# Patient Record
Sex: Male | Born: 1945 | Race: White | Hispanic: No | Marital: Married
Health system: Southern US, Community
[De-identification: ages and names within clinical notes are randomized; demographics above are authoritative.]

---

## 2001-09-06 ENCOUNTER — Encounter: Admission: RE | Admit: 2001-09-06 | Discharge: 2001-09-06 | Payer: Self-pay | Admitting: Neurosurgery

## 2001-09-06 ENCOUNTER — Encounter: Payer: Self-pay | Admitting: Neurosurgery

## 2005-09-28 ENCOUNTER — Ambulatory Visit: Payer: Self-pay | Admitting: Gastroenterology

## 2005-10-19 ENCOUNTER — Ambulatory Visit: Payer: Self-pay | Admitting: Gastroenterology

## 2007-05-27 ENCOUNTER — Ambulatory Visit: Payer: Self-pay | Admitting: Gastroenterology

## 2008-06-12 ENCOUNTER — Ambulatory Visit: Payer: Self-pay | Admitting: Internal Medicine

## 2008-06-24 ENCOUNTER — Ambulatory Visit: Payer: Self-pay | Admitting: Family Medicine

## 2008-06-29 ENCOUNTER — Ambulatory Visit: Payer: Self-pay | Admitting: Internal Medicine

## 2008-07-01 ENCOUNTER — Ambulatory Visit: Payer: Self-pay | Admitting: Internal Medicine

## 2008-07-09 ENCOUNTER — Ambulatory Visit: Payer: Self-pay | Admitting: Internal Medicine

## 2008-07-13 ENCOUNTER — Ambulatory Visit: Payer: Self-pay | Admitting: Internal Medicine

## 2008-08-10 ENCOUNTER — Ambulatory Visit: Payer: Self-pay | Admitting: Internal Medicine

## 2008-09-10 ENCOUNTER — Ambulatory Visit: Payer: Self-pay | Admitting: Internal Medicine

## 2008-10-08 ENCOUNTER — Ambulatory Visit: Payer: Self-pay | Admitting: Nurse Practitioner

## 2008-10-10 ENCOUNTER — Ambulatory Visit: Payer: Self-pay | Admitting: Internal Medicine

## 2008-10-10 ENCOUNTER — Emergency Department: Payer: Self-pay | Admitting: Emergency Medicine

## 2008-10-19 ENCOUNTER — Ambulatory Visit: Payer: Self-pay | Admitting: Vascular Surgery

## 2008-11-10 ENCOUNTER — Ambulatory Visit: Payer: Self-pay | Admitting: Internal Medicine

## 2008-12-10 ENCOUNTER — Ambulatory Visit: Payer: Self-pay | Admitting: Internal Medicine

## 2009-01-10 ENCOUNTER — Ambulatory Visit: Payer: Self-pay | Admitting: Internal Medicine

## 2009-02-10 ENCOUNTER — Ambulatory Visit: Payer: Self-pay | Admitting: Internal Medicine

## 2009-03-02 ENCOUNTER — Emergency Department: Payer: Self-pay | Admitting: Emergency Medicine

## 2009-03-12 ENCOUNTER — Ambulatory Visit: Payer: Self-pay | Admitting: Internal Medicine

## 2009-04-12 ENCOUNTER — Ambulatory Visit: Payer: Self-pay | Admitting: Internal Medicine

## 2009-05-12 ENCOUNTER — Ambulatory Visit: Payer: Self-pay | Admitting: Internal Medicine

## 2009-06-05 ENCOUNTER — Emergency Department: Payer: Self-pay | Admitting: Emergency Medicine

## 2009-06-12 ENCOUNTER — Ambulatory Visit: Payer: Self-pay | Admitting: Internal Medicine

## 2009-07-07 ENCOUNTER — Ambulatory Visit: Payer: Self-pay | Admitting: Cardiology

## 2009-07-13 ENCOUNTER — Ambulatory Visit: Payer: Self-pay | Admitting: Internal Medicine

## 2009-08-10 ENCOUNTER — Ambulatory Visit: Payer: Self-pay | Admitting: Internal Medicine

## 2009-09-10 ENCOUNTER — Ambulatory Visit: Payer: Self-pay | Admitting: Internal Medicine

## 2009-10-10 ENCOUNTER — Ambulatory Visit: Payer: Self-pay | Admitting: Internal Medicine

## 2009-11-10 ENCOUNTER — Ambulatory Visit: Payer: Self-pay | Admitting: Internal Medicine

## 2009-12-10 ENCOUNTER — Ambulatory Visit: Payer: Self-pay | Admitting: Internal Medicine

## 2010-01-03 ENCOUNTER — Ambulatory Visit: Payer: Self-pay | Admitting: Internal Medicine

## 2010-01-10 ENCOUNTER — Ambulatory Visit: Payer: Self-pay | Admitting: Internal Medicine

## 2010-02-10 ENCOUNTER — Ambulatory Visit: Payer: Self-pay | Admitting: Internal Medicine

## 2010-03-12 ENCOUNTER — Ambulatory Visit: Payer: Self-pay | Admitting: Internal Medicine

## 2010-04-06 ENCOUNTER — Encounter: Payer: Self-pay | Admitting: Neurosurgery

## 2010-04-12 ENCOUNTER — Ambulatory Visit: Payer: Self-pay | Admitting: Internal Medicine

## 2010-04-12 ENCOUNTER — Encounter: Payer: Self-pay | Admitting: Neurosurgery

## 2010-04-28 ENCOUNTER — Emergency Department: Payer: Self-pay | Admitting: Unknown Physician Specialty

## 2010-05-12 ENCOUNTER — Ambulatory Visit: Payer: Self-pay | Admitting: Internal Medicine

## 2010-05-12 ENCOUNTER — Encounter: Payer: Self-pay | Admitting: Neurosurgery

## 2010-06-12 ENCOUNTER — Ambulatory Visit: Payer: Self-pay | Admitting: Internal Medicine

## 2010-06-12 ENCOUNTER — Encounter: Payer: Self-pay | Admitting: Neurosurgery

## 2010-06-13 ENCOUNTER — Ambulatory Visit: Payer: Self-pay | Admitting: Internal Medicine

## 2010-06-16 ENCOUNTER — Ambulatory Visit: Payer: Self-pay | Admitting: Internal Medicine

## 2010-07-13 ENCOUNTER — Ambulatory Visit: Payer: Self-pay | Admitting: Internal Medicine

## 2010-08-11 ENCOUNTER — Ambulatory Visit: Payer: Self-pay | Admitting: Internal Medicine

## 2010-09-11 ENCOUNTER — Ambulatory Visit: Payer: Self-pay | Admitting: Internal Medicine

## 2010-10-11 ENCOUNTER — Ambulatory Visit: Payer: Self-pay | Admitting: Internal Medicine

## 2010-11-11 ENCOUNTER — Ambulatory Visit: Payer: Self-pay | Admitting: Internal Medicine

## 2010-12-11 ENCOUNTER — Ambulatory Visit: Payer: Self-pay | Admitting: Internal Medicine

## 2010-12-22 ENCOUNTER — Ambulatory Visit: Payer: Self-pay | Admitting: Internal Medicine

## 2011-01-11 ENCOUNTER — Ambulatory Visit: Payer: Self-pay | Admitting: Internal Medicine

## 2011-02-07 ENCOUNTER — Emergency Department: Payer: Self-pay | Admitting: Emergency Medicine

## 2011-02-11 ENCOUNTER — Ambulatory Visit: Payer: Self-pay | Admitting: Internal Medicine

## 2011-03-13 ENCOUNTER — Ambulatory Visit: Payer: Self-pay | Admitting: Internal Medicine

## 2011-04-05 ENCOUNTER — Ambulatory Visit: Payer: Self-pay | Admitting: Internal Medicine

## 2011-04-13 ENCOUNTER — Ambulatory Visit: Payer: Self-pay | Admitting: Internal Medicine

## 2011-05-13 ENCOUNTER — Ambulatory Visit: Payer: Self-pay | Admitting: Internal Medicine

## 2011-06-13 ENCOUNTER — Ambulatory Visit: Payer: Self-pay | Admitting: Internal Medicine

## 2011-06-14 LAB — CBC CANCER CENTER
Basophil: 4 %
Eosinophil: 4 %
HCT: 31.8 % — ABNORMAL LOW (ref 40.0–52.0)
HGB: 10.2 g/dL — ABNORMAL LOW (ref 13.0–18.0)
Lymphocytes: 17 %
MCH: 27.3 pg (ref 26.0–34.0)
MCV: 85 fL (ref 80–100)
RBC: 3.73 10*6/uL — ABNORMAL LOW (ref 4.40–5.90)
RDW: 21.8 % — ABNORMAL HIGH (ref 11.5–14.5)

## 2011-06-20 LAB — CBC CANCER CENTER
HGB: 10.3 g/dL — ABNORMAL LOW (ref 13.0–18.0)
Lymphocytes: 22 %
MCH: 27.3 pg (ref 26.0–34.0)
MCV: 85 fL (ref 80–100)
Monocytes: 12 %
Platelet: 296 x10 3/mm (ref 150–440)
RBC: 3.79 10*6/uL — ABNORMAL LOW (ref 4.40–5.90)
RDW: 22 % — ABNORMAL HIGH (ref 11.5–14.5)
Segmented Neutrophils: 66 %
WBC: 5.5 x10 3/mm (ref 3.8–10.6)

## 2011-06-27 LAB — CBC CANCER CENTER
HGB: 10.7 g/dL — ABNORMAL LOW (ref 13.0–18.0)
Lymphocytes: 24 %
MCH: 27.3 pg (ref 26.0–34.0)
MCHC: 32.1 g/dL (ref 32.0–36.0)
MCV: 85 fL (ref 80–100)
Platelet: 231 x10 3/mm (ref 150–440)
RBC: 3.92 10*6/uL — ABNORMAL LOW (ref 4.40–5.90)
Segmented Neutrophils: 71 %
Variant Lymphocyte: 5 %

## 2011-06-27 LAB — COMPREHENSIVE METABOLIC PANEL
Albumin: 2.4 g/dL — ABNORMAL LOW (ref 3.4–5.0)
Alkaline Phosphatase: 127 U/L (ref 50–136)
Anion Gap: 10 (ref 7–16)
BUN: 11 mg/dL (ref 7–18)
Bilirubin,Total: 0.3 mg/dL (ref 0.2–1.0)
Calcium, Total: 8.7 mg/dL (ref 8.5–10.1)
Chloride: 102 mmol/L (ref 98–107)
Creatinine: 1.05 mg/dL (ref 0.60–1.30)
EGFR (Non-African Amer.): >60
Glucose: 107 mg/dL — ABNORMAL HIGH (ref 65–99)
Osmolality: 277 (ref 275–301)
Potassium: 3.7 mmol/L (ref 3.5–5.1)
SGOT(AST): 28 U/L (ref 15–37)

## 2011-06-27 LAB — PHOSPHORUS: Phosphorus: 3 mg/dL (ref 2.5–4.9)

## 2011-07-04 LAB — BASIC METABOLIC PANEL
Anion Gap: 7 (ref 7–16)
BUN: 10 mg/dL (ref 7–18)
Calcium, Total: 8.7 mg/dL (ref 8.5–10.1)
Chloride: 103 mmol/L (ref 98–107)
Co2: 30 mmol/L (ref 21–32)
Creatinine: 0.99 mg/dL (ref 0.60–1.30)
EGFR (Non-African Amer.): >60

## 2011-07-04 LAB — CBC CANCER CENTER
HCT: 33.6 % — ABNORMAL LOW (ref 40.0–52.0)
HGB: 10.7 g/dL — ABNORMAL LOW (ref 13.0–18.0)
Lymphocytes: 16 %
MCH: 27.1 pg (ref 26.0–34.0)
MCHC: 31.8 g/dL — ABNORMAL LOW (ref 32.0–36.0)
MCV: 85 fL (ref 80–100)
RDW: 21.6 % — ABNORMAL HIGH (ref 11.5–14.5)
Segmented Neutrophils: 74 %
WBC: 5.7 x10 3/mm (ref 3.8–10.6)

## 2011-07-14 ENCOUNTER — Ambulatory Visit: Payer: Self-pay | Admitting: Internal Medicine

## 2011-07-18 LAB — COMPREHENSIVE METABOLIC PANEL
Albumin: 2.5 g/dL — ABNORMAL LOW (ref 3.4–5.0)
Anion Gap: 8 (ref 7–16)
BUN: 12 mg/dL (ref 7–18)
Bilirubin,Total: 0.3 mg/dL (ref 0.2–1.0)
Calcium, Total: 8.9 mg/dL (ref 8.5–10.1)
Co2: 31 mmol/L (ref 21–32)
EGFR (African American): >60
EGFR (Non-African Amer.): >60
Glucose: 197 mg/dL — ABNORMAL HIGH (ref 65–99)
Osmolality: 285 (ref 275–301)
Potassium: 3.6 mmol/L (ref 3.5–5.1)
SGOT(AST): 29 U/L (ref 15–37)
SGPT (ALT): 32 U/L
Total Protein: 8 g/dL (ref 6.4–8.2)

## 2011-07-18 LAB — CBC CANCER CENTER
Bands: 2 %
Eosinophil: 4 %
HGB: 10.9 g/dL — ABNORMAL LOW (ref 13.0–18.0)
MCHC: 32.1 g/dL (ref 32.0–36.0)
Monocytes: 7 %
Platelet: 320 x10 3/mm (ref 150–440)
RBC: 3.91 10*6/uL — ABNORMAL LOW (ref 4.40–5.90)
Segmented Neutrophils: 71 %
WBC: 5 x10 3/mm (ref 3.8–10.6)

## 2011-08-11 ENCOUNTER — Ambulatory Visit: Payer: Self-pay | Admitting: Internal Medicine

## 2011-09-21 ENCOUNTER — Ambulatory Visit: Payer: Self-pay | Admitting: Oncology

## 2011-09-21 LAB — CBC CANCER CENTER
Basophil #: 0 x10 3/mm (ref 0.0–0.1)
Basophil %: 0 %
Eosinophil #: 0.1 x10 3/mm (ref 0.0–0.7)
Eosinophil %: 0.9 %
Eosinophil: 2 %
HGB: 9.6 g/dL — ABNORMAL LOW (ref 13.0–18.0)
Lymphocyte %: 13.1 %
MCHC: 32.4 g/dL (ref 32.0–36.0)
Monocyte #: 0.8 x10 3/mm (ref 0.2–1.0)
Monocyte %: 8.2 %
Monocytes: 13 %
Platelet: 207 x10 3/mm (ref 150–440)
RDW: 20.1 % — ABNORMAL HIGH (ref 11.5–14.5)

## 2011-09-21 LAB — MAGNESIUM: Magnesium: 2.1 mg/dL

## 2011-09-21 LAB — COMPREHENSIVE METABOLIC PANEL
Albumin: 2 g/dL — ABNORMAL LOW (ref 3.4–5.0)
Anion Gap: 8 (ref 7–16)
BUN: 11 mg/dL (ref 7–18)
Chloride: 104 mmol/L (ref 98–107)
Co2: 29 mmol/L (ref 21–32)
EGFR (Non-African Amer.): >60
Glucose: 132 mg/dL — ABNORMAL HIGH (ref 65–99)
Osmolality: 283 (ref 275–301)
Potassium: 3.2 mmol/L — ABNORMAL LOW (ref 3.5–5.1)
SGOT(AST): 33 U/L (ref 15–37)
Sodium: 141 mmol/L (ref 136–145)

## 2011-10-02 ENCOUNTER — Emergency Department: Payer: Self-pay | Admitting: *Deleted

## 2011-10-02 LAB — CBC WITH DIFFERENTIAL/PLATELET
Basophil #: 0.1 10*3/uL (ref 0.0–0.1)
Eosinophil %: 5.3 %
Lymphocyte #: 1.1 10*3/uL (ref 1.0–3.6)
Lymphocyte %: 18.7 %
MCHC: 32 g/dL (ref 32.0–36.0)
Monocyte #: 0.6 x10 3/mm (ref 0.2–1.0)
Neutrophil #: 3.6 10*3/uL (ref 1.4–6.5)
Platelet: 226 10*3/uL (ref 150–440)
RBC: 3.29 10*6/uL — ABNORMAL LOW (ref 4.40–5.90)

## 2011-10-02 LAB — COMPREHENSIVE METABOLIC PANEL
Albumin: 2.3 g/dL — ABNORMAL LOW (ref 3.4–5.0)
Anion Gap: 9 (ref 7–16)
BUN: 13 mg/dL (ref 7–18)
Calcium, Total: 10.4 mg/dL — ABNORMAL HIGH (ref 8.5–10.1)
Co2: 28 mmol/L (ref 21–32)
Creatinine: 1.03 mg/dL (ref 0.60–1.30)
EGFR (Non-African Amer.): >60
Osmolality: 275 (ref 275–301)
Potassium: 3.7 mmol/L (ref 3.5–5.1)
SGOT(AST): 26 U/L (ref 15–37)
SGPT (ALT): 21 U/L
Sodium: 138 mmol/L (ref 136–145)
Total Protein: 7.4 g/dL (ref 6.4–8.2)

## 2011-10-02 LAB — URINALYSIS, COMPLETE
Bilirubin,UR: NEGATIVE
Glucose,UR: NEGATIVE mg/dL (ref 0–75)
Ketone: NEGATIVE
Leukocyte Esterase: NEGATIVE
Nitrite: NEGATIVE
Protein: 30
RBC,UR: 4333 /HPF (ref 0–5)
Squamous Epithelial: NONE SEEN
WBC UR: 2 /HPF (ref 0–5)

## 2011-10-11 ENCOUNTER — Ambulatory Visit: Payer: Self-pay | Admitting: Oncology

## 2011-10-27 ENCOUNTER — Emergency Department: Payer: Self-pay | Admitting: Emergency Medicine

## 2011-10-27 LAB — CBC WITH DIFFERENTIAL/PLATELET
Basophil %: 1.4 %
Eosinophil %: 3.2 %
HGB: 10.6 g/dL — ABNORMAL LOW (ref 13.0–18.0)
Lymphocyte %: 20.1 %
MCHC: 33.3 g/dL (ref 32.0–36.0)
MCV: 90 fL (ref 80–100)
Monocyte #: 0.3 x10 3/mm (ref 0.2–1.0)
Monocyte %: 6.3 %
Neutrophil #: 3.8 10*3/uL (ref 1.4–6.5)
Neutrophil %: 69 %
Platelet: 128 10*3/uL — ABNORMAL LOW (ref 150–440)
RDW: 16.8 % — ABNORMAL HIGH (ref 11.5–14.5)
WBC: 5.6 10*3/uL (ref 3.8–10.6)

## 2011-10-27 LAB — BASIC METABOLIC PANEL
Anion Gap: 8 (ref 7–16)
BUN: 14 mg/dL (ref 7–18)
Co2: 28 mmol/L (ref 21–32)
Creatinine: 0.74 mg/dL (ref 0.60–1.30)
EGFR (African American): >60
EGFR (Non-African Amer.): >60
Glucose: 97 mg/dL (ref 65–99)

## 2011-11-02 LAB — CULTURE, BLOOD (SINGLE)

## 2011-11-11 ENCOUNTER — Ambulatory Visit: Payer: Self-pay | Admitting: Oncology

## 2011-12-28 ENCOUNTER — Ambulatory Visit: Payer: Self-pay | Admitting: Oncology

## 2012-01-11 ENCOUNTER — Ambulatory Visit: Payer: Self-pay | Admitting: Oncology

## 2012-01-16 LAB — CBC CANCER CENTER
Basophil #: 0.1 x10 3/mm (ref 0.0–0.1)
Basophil %: 1.9 %
Eosinophil #: 0.2 x10 3/mm (ref 0.0–0.7)
Eosinophil %: 2.7 %
HCT: 28.1 % — ABNORMAL LOW (ref 40.0–52.0)
HGB: 9.1 g/dL — ABNORMAL LOW (ref 13.0–18.0)
Lymphocyte #: 1.1 x10 3/mm (ref 1.0–3.6)
Lymphocyte %: 19 %
MCH: 26.9 pg (ref 26.0–34.0)
MCHC: 32.3 g/dL (ref 32.0–36.0)
MCV: 83 fL (ref 80–100)
Monocyte #: 0.5 x10 3/mm (ref 0.2–1.0)
Monocyte %: 7.9 %
Neutrophil #: 4.1 x10 3/mm (ref 1.4–6.5)
Neutrophil %: 68.5 %
Platelet: 247 x10 3/mm (ref 150–440)
RBC: 3.37 10*6/uL — ABNORMAL LOW (ref 4.40–5.90)
RDW: 18 % — ABNORMAL HIGH (ref 11.5–14.5)
WBC: 6 x10 3/mm (ref 3.8–10.6)

## 2012-02-11 ENCOUNTER — Ambulatory Visit: Payer: Self-pay | Admitting: Oncology

## 2012-03-05 LAB — CBC CANCER CENTER
Basophil #: 0.1 x10 3/mm (ref 0.0–0.1)
Basophil %: 2 %
Eosinophil #: 0.2 x10 3/mm (ref 0.0–0.7)
Eosinophil %: 3.1 %
HCT: 24.4 % — ABNORMAL LOW (ref 40.0–52.0)
HGB: 7.5 g/dL — ABNORMAL LOW (ref 13.0–18.0)
Lymphocyte #: 1.5 x10 3/mm (ref 1.0–3.6)
Lymphocyte %: 27.5 %
MCH: 24.7 pg — ABNORMAL LOW (ref 26.0–34.0)
MCHC: 30.7 g/dL — ABNORMAL LOW (ref 32.0–36.0)
MCV: 81 fL (ref 80–100)
Monocyte #: 0.5 x10 3/mm (ref 0.2–1.0)
Monocyte %: 9.6 %
Neutrophil #: 3.2 x10 3/mm (ref 1.4–6.5)
Neutrophil %: 57.8 %
Platelet: 161 x10 3/mm (ref 150–440)
RBC: 3.02 10*6/uL — ABNORMAL LOW (ref 4.40–5.90)
RDW: 19.1 % — ABNORMAL HIGH (ref 11.5–14.5)
WBC: 5.5 x10 3/mm (ref 3.8–10.6)

## 2012-03-05 LAB — COMPREHENSIVE METABOLIC PANEL
Albumin: 2.4 g/dL — ABNORMAL LOW (ref 3.4–5.0)
Alkaline Phosphatase: 188 U/L — ABNORMAL HIGH (ref 50–136)
Anion Gap: 6 — ABNORMAL LOW (ref 7–16)
BUN: 10 mg/dL (ref 7–18)
Bilirubin,Total: 0.3 mg/dL (ref 0.2–1.0)
Calcium, Total: 8.7 mg/dL (ref 8.5–10.1)
Chloride: 106 mmol/L (ref 98–107)
Co2: 32 mmol/L (ref 21–32)
Creatinine: 1.1 mg/dL (ref 0.60–1.30)
EGFR (African American): >60
EGFR (Non-African Amer.): >60
Glucose: 104 mg/dL — ABNORMAL HIGH (ref 65–99)
Osmolality: 286 (ref 275–301)
Potassium: 3.3 mmol/L — ABNORMAL LOW (ref 3.5–5.1)
SGOT(AST): 30 U/L (ref 15–37)
SGPT (ALT): 24 U/L (ref 12–78)
Sodium: 144 mmol/L (ref 136–145)
Total Protein: 7.3 g/dL (ref 6.4–8.2)

## 2012-03-06 ENCOUNTER — Inpatient Hospital Stay: Payer: Self-pay | Admitting: Internal Medicine

## 2012-03-06 LAB — URINALYSIS, COMPLETE
Bilirubin,UR: NEGATIVE
Blood: NEGATIVE
Glucose,UR: NEGATIVE mg/dL (ref 0–75)
Leukocyte Esterase: NEGATIVE
Nitrite: NEGATIVE
Ph: 7 (ref 4.5–8.0)
Protein: 30
RBC,UR: 5 /HPF (ref 0–5)
Specific Gravity: 1.016 (ref 1.003–1.030)
Squamous Epithelial: NONE SEEN
WBC UR: 1 /HPF (ref 0–5)

## 2012-03-06 LAB — CBC
HCT: 27.3 % — ABNORMAL LOW (ref 40.0–52.0)
HGB: 8.6 g/dL — ABNORMAL LOW (ref 13.0–18.0)
MCH: 24.9 pg — ABNORMAL LOW (ref 26.0–34.0)
MCHC: 31.4 g/dL — ABNORMAL LOW (ref 32.0–36.0)
MCV: 79 fL — ABNORMAL LOW (ref 80–100)
Platelet: 149 10*3/uL — ABNORMAL LOW (ref 150–440)
RBC: 3.45 10*6/uL — ABNORMAL LOW (ref 4.40–5.90)
RDW: 18.3 % — ABNORMAL HIGH (ref 11.5–14.5)
WBC: 4.9 10*3/uL (ref 3.8–10.6)

## 2012-03-06 LAB — COMPREHENSIVE METABOLIC PANEL
Albumin: 2.4 g/dL — ABNORMAL LOW (ref 3.4–5.0)
Alkaline Phosphatase: 176 U/L — ABNORMAL HIGH (ref 50–136)
Anion Gap: 10 (ref 7–16)
BUN: 13 mg/dL (ref 7–18)
Bilirubin,Total: 0.6 mg/dL (ref 0.2–1.0)
Calcium, Total: 9.1 mg/dL (ref 8.5–10.1)
Chloride: 105 mmol/L (ref 98–107)
Co2: 28 mmol/L (ref 21–32)
Creatinine: 0.93 mg/dL (ref 0.60–1.30)
EGFR (African American): >60
EGFR (Non-African Amer.): >60
Glucose: 73 mg/dL (ref 65–99)
Osmolality: 284 (ref 275–301)
Potassium: 2.9 mmol/L — ABNORMAL LOW (ref 3.5–5.1)
SGOT(AST): 22 U/L (ref 15–37)
SGPT (ALT): 16 U/L (ref 12–78)
Sodium: 143 mmol/L (ref 136–145)
Total Protein: 7.2 g/dL (ref 6.4–8.2)

## 2012-03-06 LAB — LIPASE, BLOOD: Lipase: 27 U/L — ABNORMAL LOW (ref 73–393)

## 2012-03-07 LAB — BASIC METABOLIC PANEL
Calcium, Total: 8.8 mg/dL (ref 8.5–10.1)
Chloride: 106 mmol/L (ref 98–107)
Co2: 29 mmol/L (ref 21–32)
Creatinine: 0.88 mg/dL (ref 0.60–1.30)
EGFR (African American): >60
EGFR (Non-African Amer.): >60
Osmolality: 286 (ref 275–301)
Sodium: 145 mmol/L (ref 136–145)

## 2012-03-08 LAB — BASIC METABOLIC PANEL
Anion Gap: 12 (ref 7–16)
BUN: 14 mg/dL (ref 7–18)
Chloride: 111 mmol/L — ABNORMAL HIGH (ref 98–107)
Co2: 25 mmol/L (ref 21–32)
Creatinine: 1.28 mg/dL (ref 0.60–1.30)
EGFR (African American): >60
EGFR (Non-African Amer.): 59 — ABNORMAL LOW
Glucose: 242 mg/dL — ABNORMAL HIGH (ref 65–99)
Osmolality: 303 (ref 275–301)

## 2012-03-08 LAB — CBC WITH DIFFERENTIAL/PLATELET
Basophil #: 0.1 10*3/uL (ref 0.0–0.1)
Basophil %: 0.8 %
Eosinophil #: 0 10*3/uL (ref 0.0–0.7)
Eosinophil %: 0 %
HGB: 9.6 g/dL — ABNORMAL LOW (ref 13.0–18.0)
Lymphocyte %: 2.4 %
MCH: 25.2 pg — ABNORMAL LOW (ref 26.0–34.0)
Neutrophil %: 95.1 %
Platelet: 140 10*3/uL — ABNORMAL LOW (ref 150–440)
RBC: 3.81 10*6/uL — ABNORMAL LOW (ref 4.40–5.90)
RDW: 18.4 % — ABNORMAL HIGH (ref 11.5–14.5)
WBC: 13.2 10*3/uL — ABNORMAL HIGH (ref 3.8–10.6)

## 2012-03-09 LAB — CBC WITH DIFFERENTIAL/PLATELET
Basophil #: 0.1 10*3/uL (ref 0.0–0.1)
Basophil %: 0.5 %
Eosinophil #: 0 10*3/uL (ref 0.0–0.7)
Eosinophil %: 0 %
HCT: 25.8 % — ABNORMAL LOW (ref 40.0–52.0)
HGB: 8.3 g/dL — ABNORMAL LOW (ref 13.0–18.0)
Lymphocyte %: 3 %
MCH: 25.1 pg — ABNORMAL LOW (ref 26.0–34.0)
Monocyte #: 0.3 x10 3/mm (ref 0.2–1.0)
Neutrophil #: 15.9 10*3/uL — ABNORMAL HIGH (ref 1.4–6.5)
Neutrophil %: 94.5 %
RBC: 3.3 10*6/uL — ABNORMAL LOW (ref 4.40–5.90)
WBC: 16.9 10*3/uL — ABNORMAL HIGH (ref 3.8–10.6)

## 2012-03-09 LAB — BASIC METABOLIC PANEL
Anion Gap: 8 (ref 7–16)
BUN: 15 mg/dL (ref 7–18)
Calcium, Total: 8.5 mg/dL (ref 8.5–10.1)
Chloride: 112 mmol/L — ABNORMAL HIGH (ref 98–107)
Co2: 25 mmol/L (ref 21–32)
Creatinine: 0.99 mg/dL (ref 0.60–1.30)
Glucose: 147 mg/dL — ABNORMAL HIGH (ref 65–99)
Osmolality: 292 (ref 275–301)

## 2012-03-10 LAB — CBC WITH DIFFERENTIAL/PLATELET
Basophil %: 1 %
Eosinophil #: 0 10*3/uL (ref 0.0–0.7)
Eosinophil %: 0.1 %
Lymphocyte #: 0.4 10*3/uL — ABNORMAL LOW (ref 1.0–3.6)
MCH: 25.6 pg — ABNORMAL LOW (ref 26.0–34.0)
MCHC: 32.5 g/dL (ref 32.0–36.0)
MCV: 79 fL — ABNORMAL LOW (ref 80–100)
Monocyte #: 0.4 x10 3/mm (ref 0.2–1.0)
Neutrophil #: 14.1 10*3/uL — ABNORMAL HIGH (ref 1.4–6.5)
Platelet: 100 10*3/uL — ABNORMAL LOW (ref 150–440)
RDW: 19.2 % — ABNORMAL HIGH (ref 11.5–14.5)

## 2012-03-10 LAB — MAGNESIUM: Magnesium: 2.3 mg/dL

## 2012-03-10 LAB — POTASSIUM: Potassium: 3.6 mmol/L (ref 3.5–5.1)

## 2012-03-11 LAB — BASIC METABOLIC PANEL
Anion Gap: 15 (ref 7–16)
BUN: 22 mg/dL — ABNORMAL HIGH (ref 7–18)
Creatinine: 0.94 mg/dL (ref 0.60–1.30)
EGFR (Non-African Amer.): >60
Glucose: 126 mg/dL — ABNORMAL HIGH (ref 65–99)
Osmolality: 301 (ref 275–301)

## 2012-03-11 LAB — CBC WITH DIFFERENTIAL/PLATELET
Basophil #: 0 10*3/uL (ref 0.0–0.1)
Eosinophil %: 0 %
Lymphocyte #: 0.3 10*3/uL — ABNORMAL LOW (ref 1.0–3.6)
Lymphocyte %: 3.1 %
MCV: 80 fL (ref 80–100)
Monocyte #: 0.3 x10 3/mm (ref 0.2–1.0)
Monocyte %: 3.1 %
Neutrophil #: 10.2 10*3/uL — ABNORMAL HIGH (ref 1.4–6.5)
Neutrophil %: 93.6 %
Platelet: 78 10*3/uL — ABNORMAL LOW (ref 150–440)
RBC: 3.21 10*6/uL — ABNORMAL LOW (ref 4.40–5.90)
RDW: 19.5 % — ABNORMAL HIGH (ref 11.5–14.5)
WBC: 10.9 10*3/uL — ABNORMAL HIGH (ref 3.8–10.6)

## 2012-03-12 ENCOUNTER — Ambulatory Visit: Payer: Self-pay | Admitting: Oncology

## 2012-03-12 LAB — CBC WITH DIFFERENTIAL/PLATELET
Basophil #: 0.1 10*3/uL (ref 0.0–0.1)
Basophil %: 1.4 %
Eosinophil #: 0 10*3/uL (ref 0.0–0.7)
Eosinophil %: 0 %
Lymphocyte %: 3.4 %
MCH: 25.1 pg — ABNORMAL LOW (ref 26.0–34.0)
MCHC: 31.1 g/dL — ABNORMAL LOW (ref 32.0–36.0)
Monocyte #: 0.4 x10 3/mm (ref 0.2–1.0)
Neutrophil %: 90.3 %
Platelet: 76 10*3/uL — ABNORMAL LOW (ref 150–440)
RBC: 3.45 10*6/uL — ABNORMAL LOW (ref 4.40–5.90)
RDW: 19.2 % — ABNORMAL HIGH (ref 11.5–14.5)

## 2012-03-12 LAB — BASIC METABOLIC PANEL
Anion Gap: 10 (ref 7–16)
Calcium, Total: 8.5 mg/dL (ref 8.5–10.1)
Creatinine: 1.05 mg/dL (ref 0.60–1.30)
EGFR (African American): >60
EGFR (Non-African Amer.): >60
Glucose: 145 mg/dL — ABNORMAL HIGH (ref 65–99)
Potassium: 4.4 mmol/L (ref 3.5–5.1)
Sodium: 147 mmol/L — ABNORMAL HIGH (ref 136–145)

## 2012-03-13 LAB — CBC WITH DIFFERENTIAL/PLATELET
Eosinophil %: 0 %
HCT: 28.6 % — ABNORMAL LOW (ref 40.0–52.0)
HGB: 9.3 g/dL — ABNORMAL LOW (ref 13.0–18.0)
Lymphocyte #: 0.4 10*3/uL — ABNORMAL LOW (ref 1.0–3.6)
Lymphocyte %: 4.9 %
MCV: 80 fL (ref 80–100)
Monocyte %: 6.1 %
Neutrophil #: 7.2 10*3/uL — ABNORMAL HIGH (ref 1.4–6.5)
Neutrophil %: 88.6 %
Platelet: 79 10*3/uL — ABNORMAL LOW (ref 150–440)
RBC: 3.58 10*6/uL — ABNORMAL LOW (ref 4.40–5.90)
RDW: 18.9 % — ABNORMAL HIGH (ref 11.5–14.5)
WBC: 8.1 10*3/uL (ref 3.8–10.6)

## 2012-03-13 LAB — BASIC METABOLIC PANEL
Anion Gap: 13 (ref 7–16)
Calcium, Total: 8.4 mg/dL — ABNORMAL LOW (ref 8.5–10.1)
Co2: 21 mmol/L (ref 21–32)
EGFR (African American): >60
EGFR (Non-African Amer.): >60
Glucose: 141 mg/dL — ABNORMAL HIGH (ref 65–99)
Osmolality: 296 (ref 275–301)
Sodium: 146 mmol/L — ABNORMAL HIGH (ref 136–145)

## 2012-03-14 LAB — BASIC METABOLIC PANEL
Anion Gap: 14 (ref 7–16)
BUN: 17 mg/dL (ref 7–18)
Co2: 21 mmol/L (ref 21–32)
Creatinine: 0.97 mg/dL (ref 0.60–1.30)
EGFR (African American): >60
Glucose: 102 mg/dL — ABNORMAL HIGH (ref 65–99)
Potassium: 2.6 mmol/L — ABNORMAL LOW (ref 3.5–5.1)
Sodium: 148 mmol/L — ABNORMAL HIGH (ref 136–145)

## 2012-03-15 LAB — BASIC METABOLIC PANEL
Anion Gap: 8 (ref 7–16)
BUN: 5 mg/dL — ABNORMAL LOW (ref 7–18)
Chloride: 105 mmol/L (ref 98–107)
Co2: 29 mmol/L (ref 21–32)
Creatinine: 0.82 mg/dL (ref 0.60–1.30)
Osmolality: 280 (ref 275–301)

## 2012-03-16 LAB — BASIC METABOLIC PANEL
Anion Gap: 9 (ref 7–16)
BUN: 5 mg/dL — ABNORMAL LOW (ref 7–18)
Calcium, Total: 8.6 mg/dL (ref 8.5–10.1)
Chloride: 107 mmol/L (ref 98–107)
Co2: 27 mmol/L (ref 21–32)
Creatinine: 0.74 mg/dL (ref 0.60–1.30)
EGFR (African American): >60
EGFR (Non-African Amer.): >60
Glucose: 69 mg/dL (ref 65–99)
Osmolality: 281 (ref 275–301)
Potassium: 3.5 mmol/L (ref 3.5–5.1)
Sodium: 143 mmol/L (ref 136–145)

## 2012-04-12 ENCOUNTER — Ambulatory Visit: Payer: Self-pay | Admitting: Oncology

## 2012-04-12 DEATH — deceased

## 2013-04-24 IMAGING — CT CT HEAD WITHOUT AND WITH CONTRAST
1 of 2 series · 13 of 30 positions shown, 17 images · non-contrast
Comparison: none

REASON FOR EXAM: CR [id number] Severe Headaches  Renal Cell CA
COMMENTS:

[Series 2: soft tissue wo · axial · 0.40mm/px · z∈[+664,+784]mm · 13 of 29 slices shown, 17 images]
[im 3/29  brain]
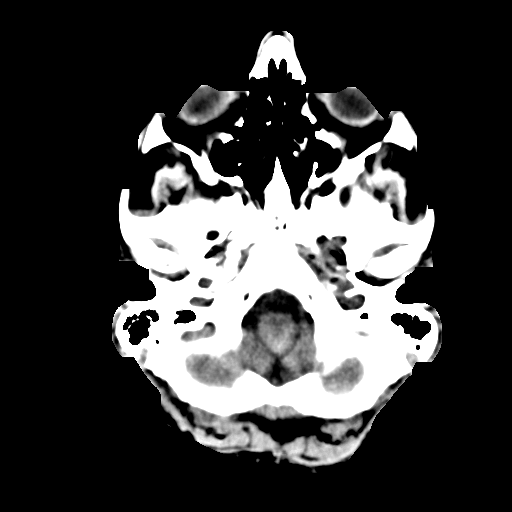
[im 3/29  bone]
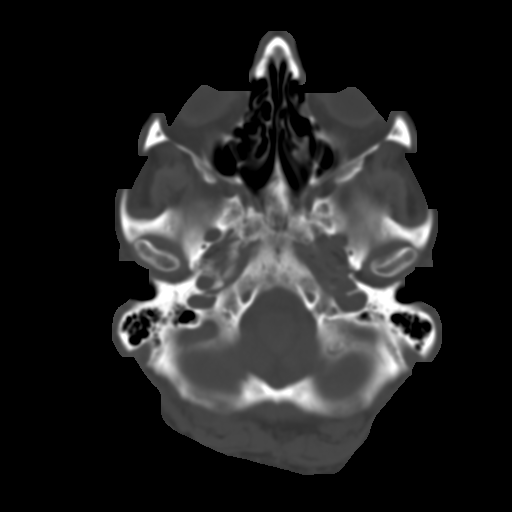
[im 5/29  brain]
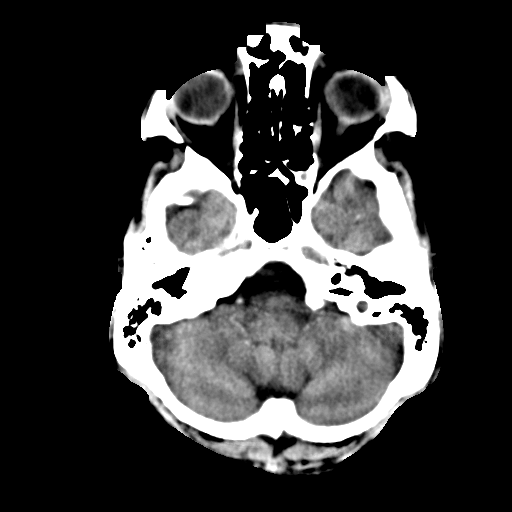
[im 7/29  brain]
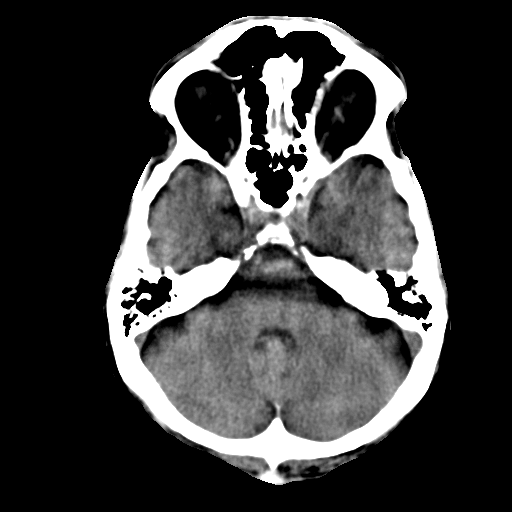
[im 9/29  brain]
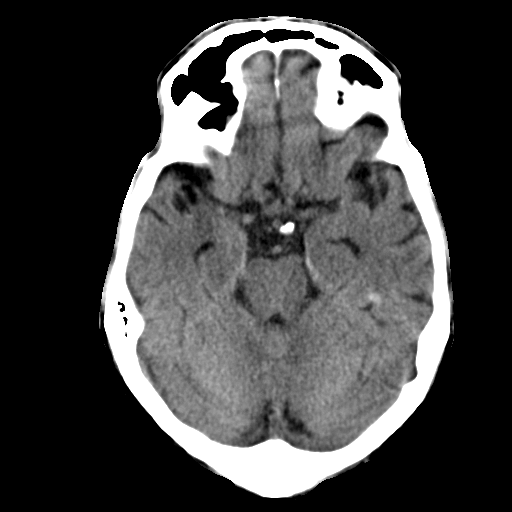
[im 11/29  brain]
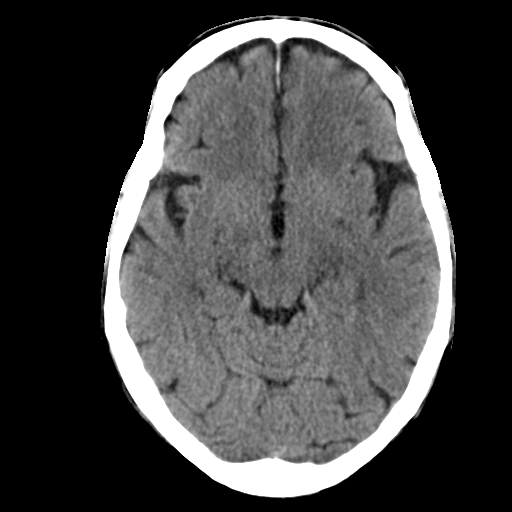
[im 11/29  bone]
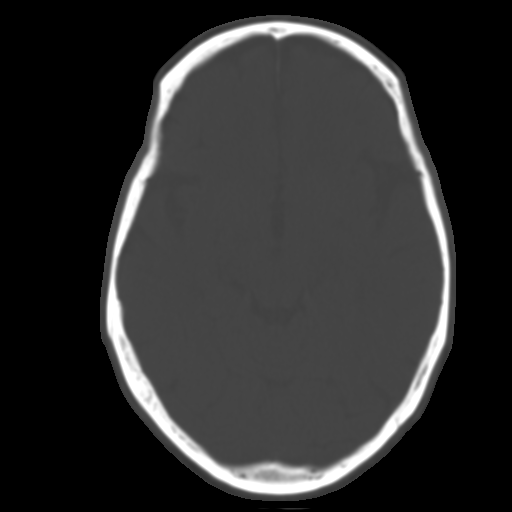
[im 13/29  brain]
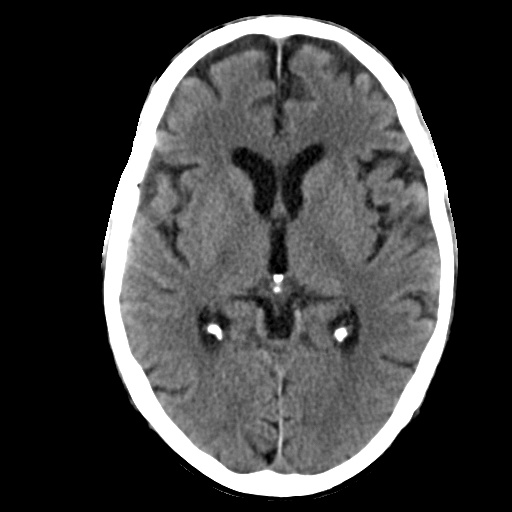
[im 15/29  brain]
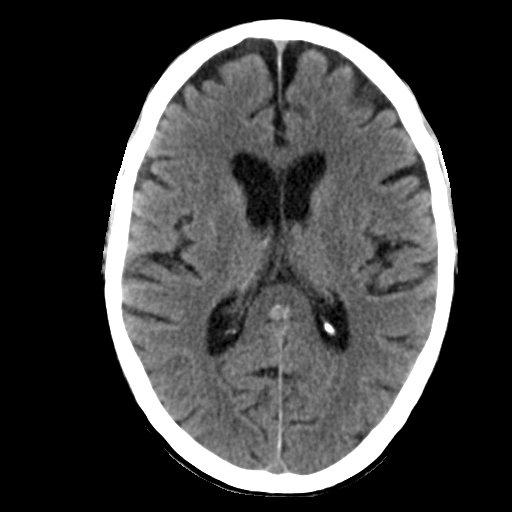
[im 17/29  brain]
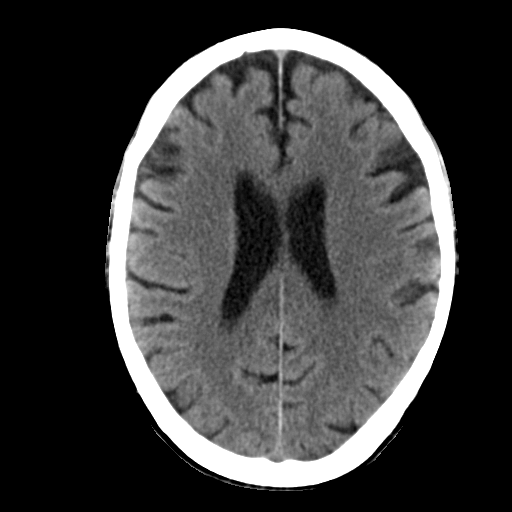
[im 19/29  brain]
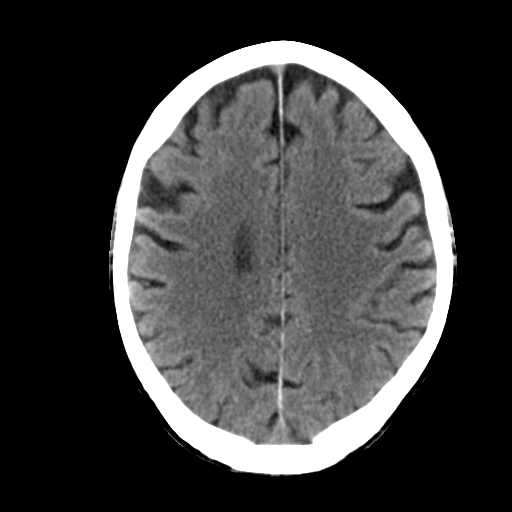
[im 19/29  bone]
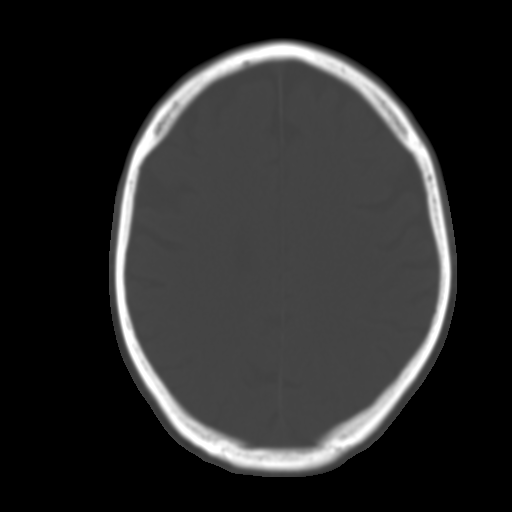
[im 21/29  brain]
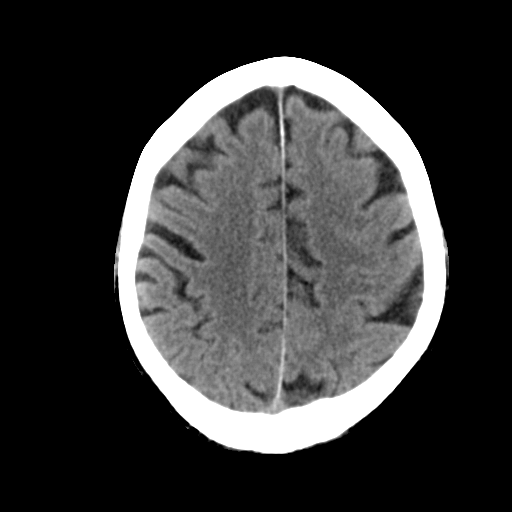
[im 23/29  brain]
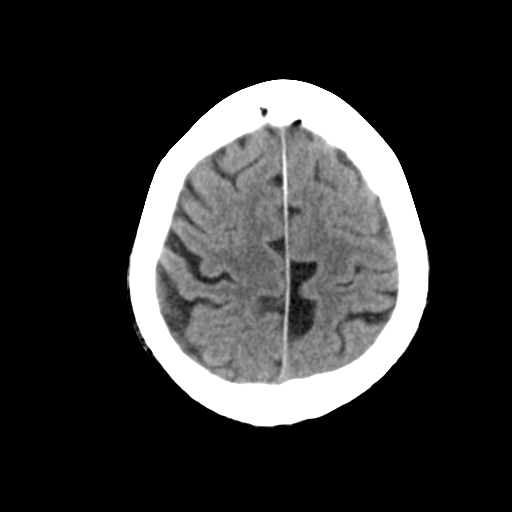
[im 25/29  brain]
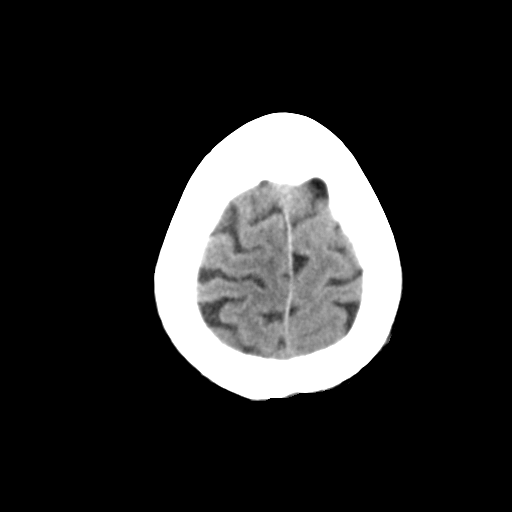
[im 27/29  brain]
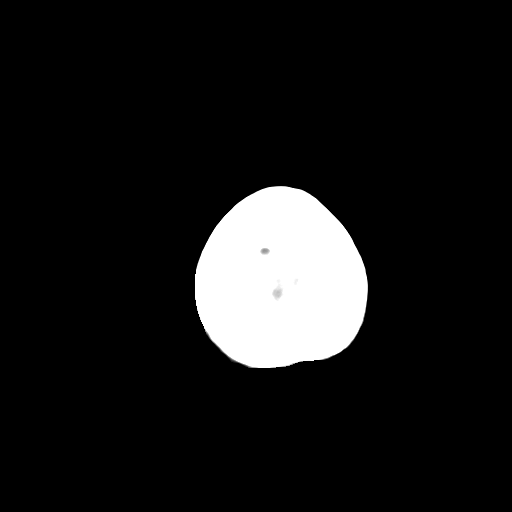
[im 27/29  bone]
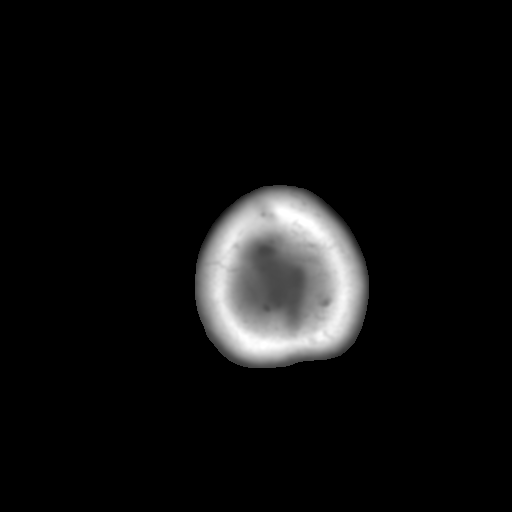

[13 of 30 positions shown; findings below may reference images not displayed]

PROCEDURE:     DELUCA - SIDDIQ LUVIANO/PARAMU  - May 01, 2011  [DATE]

RESULT:     Axial CT scanning was performed through the brain with
reconstructions at 5 mm intervals and slice thicknesses both prior to and
following administration of 50 cc of Lsovue-ALL. There are no previous
studies for comparison.

The ventricles are normal in size and position. There is mild diffuse
cerebral atrophy out of proportion to the patient's age. The cerebellum does
not appear significantly atrophic. There is no shift of the midline. There
is no intracranial hemorrhage nor intracranial mass effect. There is no
evidence of an evolving ischemic event.

Following contrast administration the enhancement pattern of the brain
parenchyma is normal. At bone window settings I do not see evidence of an
acute skull fracture. The mastoid air cells and paranasal sinuses appear
well pneumatized. No lytic or blastic calvarial lesion is demonstrated.
IMPRESSION: 1. I see no evidence of metastatic disease to the brain.
2. I see no evidence of an acute ischemic or hemorrhagic event.
3. There is mild diffuse cerebral atrophy out of proportion to the patient's
age.
4. I see no calvarial metastatic disease.

This report was called by me to the [HOSPITAL] [HOSPITAL] and the report
given to Bei at the conclusion of the study.

## 2013-09-25 IMAGING — CT CT STONE STUDY
1 of 2 series · 15 of 32 positions shown, 19 images · non-contrast
Comparison: none

REASON FOR EXAM: left flank pain, hematuria, hx of renal ca
COMMENTS:

PROCEDURE:     CT  - CT ABDOMEN /PELVIS WO (STONE)  - October 02, 2011 [DATE]
RESULT:     This study was compared to a previous study dated 09/08/2009.
TECHNIQUE: Helical noncontrasted 3 mm sections were obtained from the lung
bases through the pubic symphysis.

[Series 2: stone · axial · 0.64mm/px · z∈[+90,+532]mm · 15 of 165 slices shown, 19 images]
[im 12/165  soft-tissue]
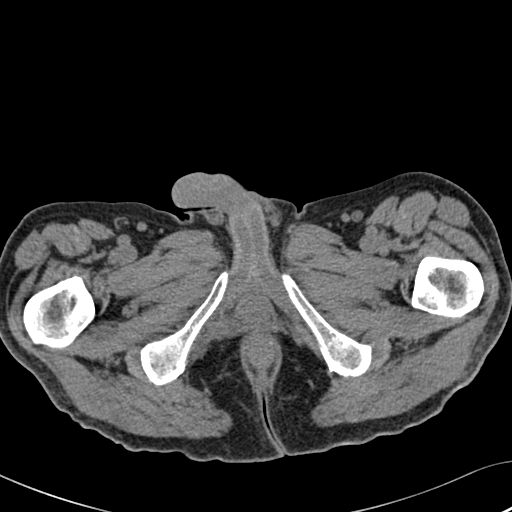
[im 12/165  bone]
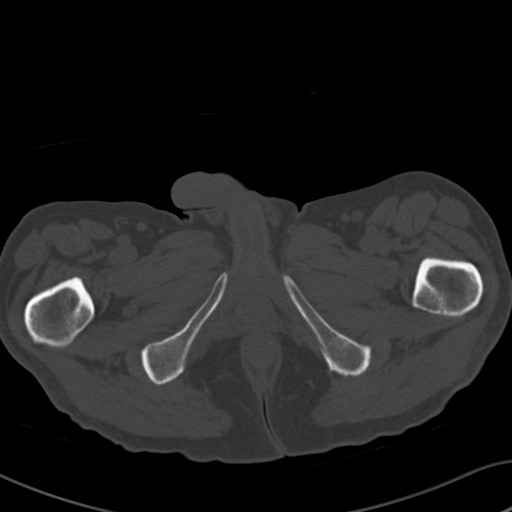
[im 24/165  soft-tissue]
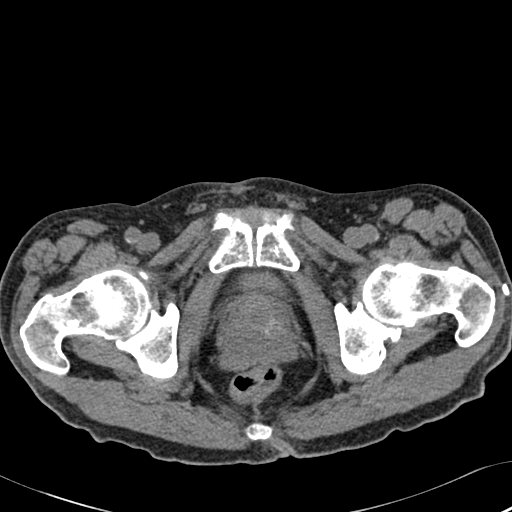
[im 36/165  soft-tissue]
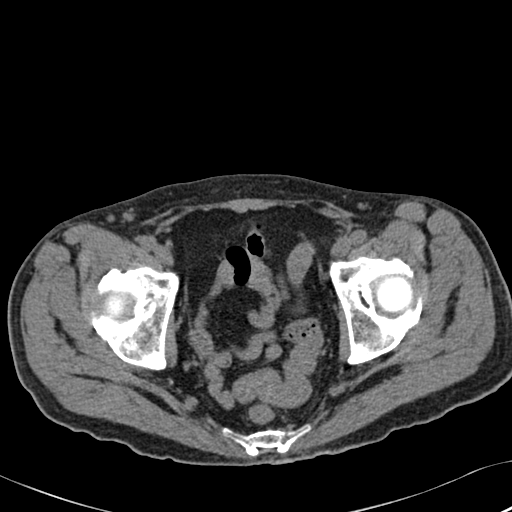
[im 47/165  soft-tissue]
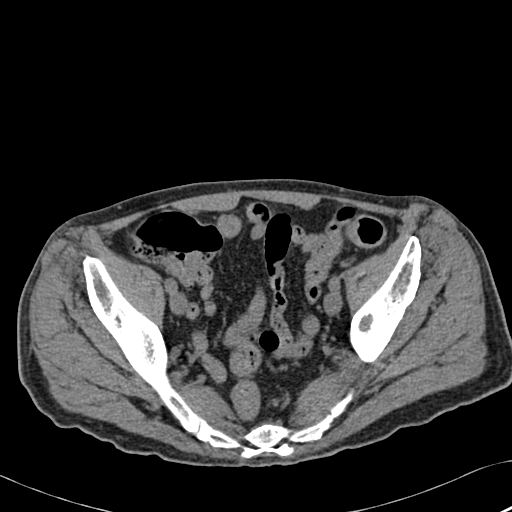
[im 59/165  soft-tissue]
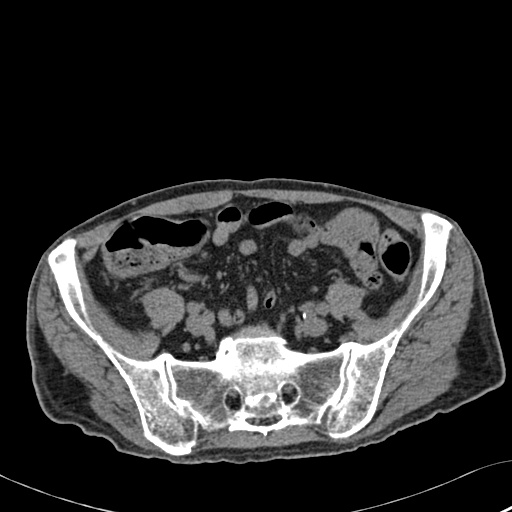
[im 71/165  soft-tissue]
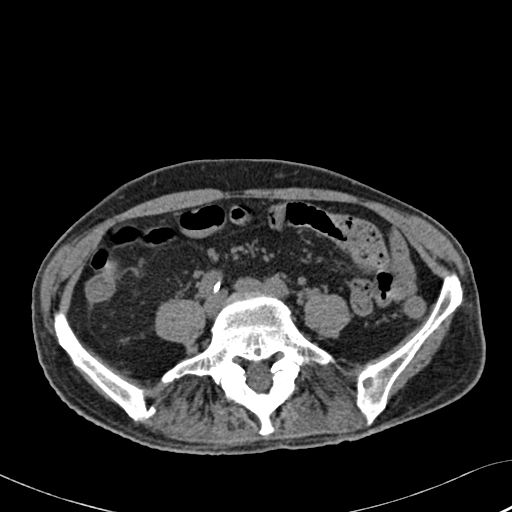
[im 83/165  soft-tissue]
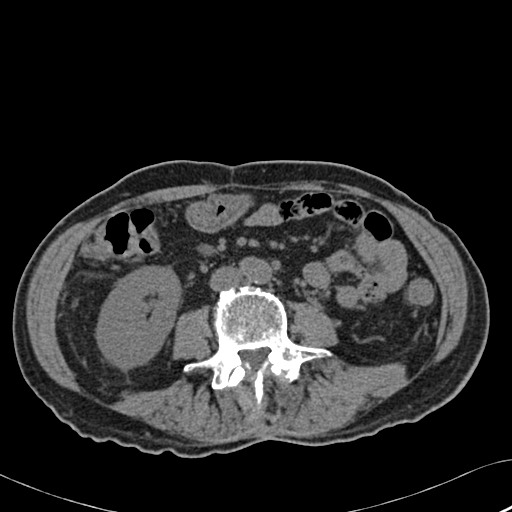
[im 94/165  soft-tissue]
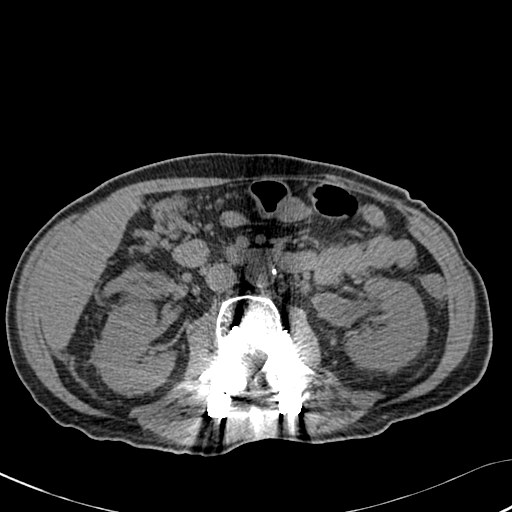
[im 106/165  soft-tissue]
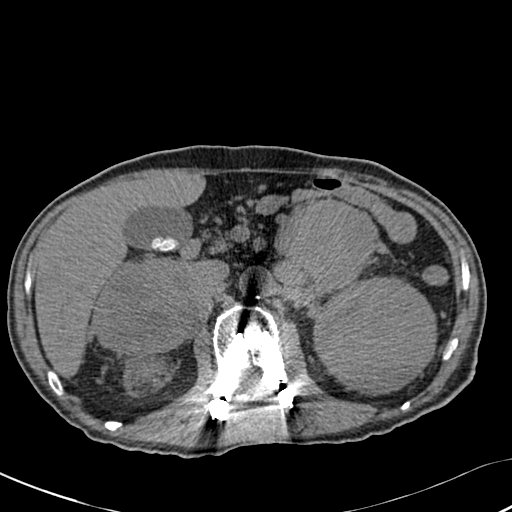
[im 106/165  bone]
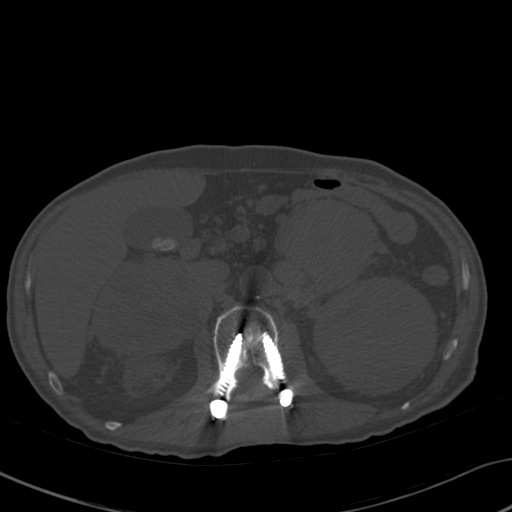
[im 118/165  soft-tissue]
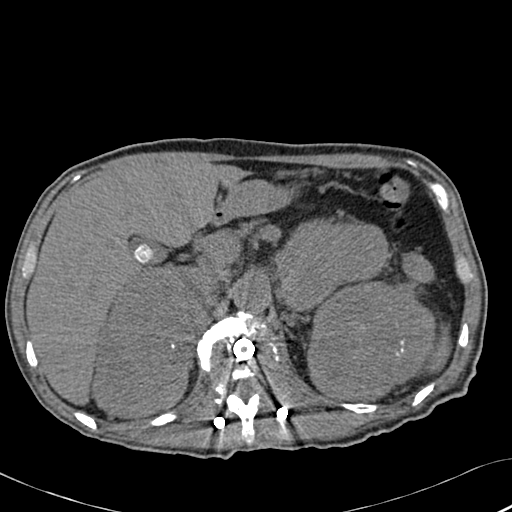
[im 129/165  soft-tissue]
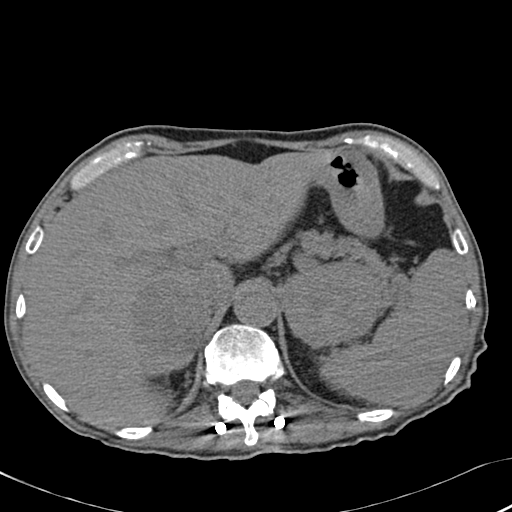
[im 141/165  soft-tissue]
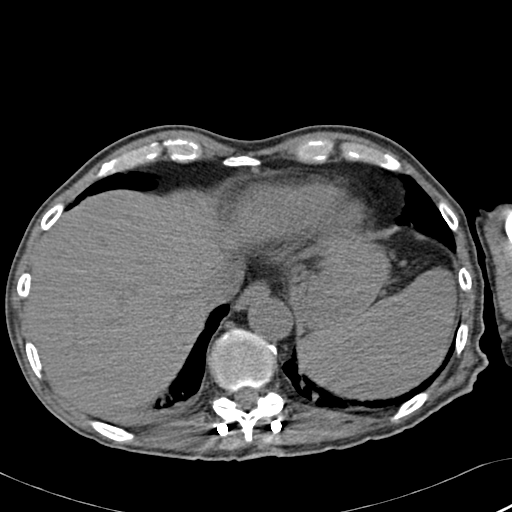
[im 141/165  lung]
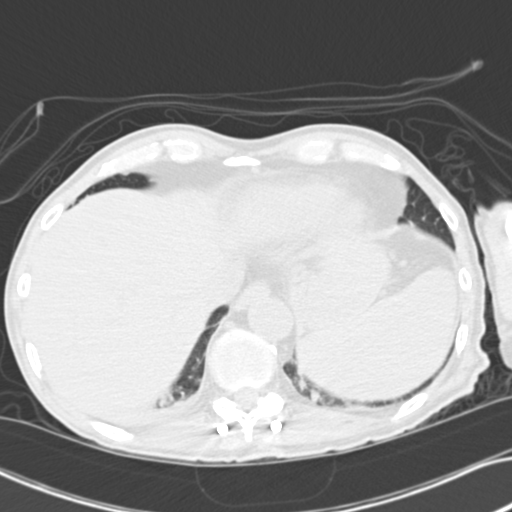
[im 147/165  lung]
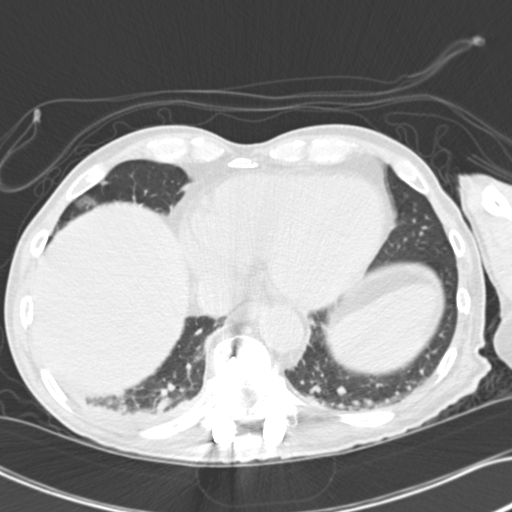
[im 153/165  soft-tissue]
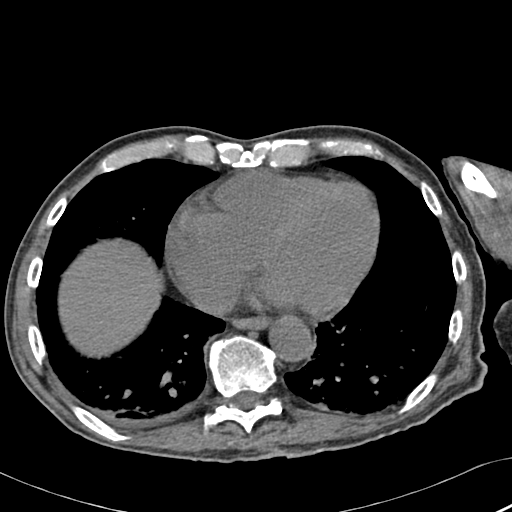
[im 153/165  lung]
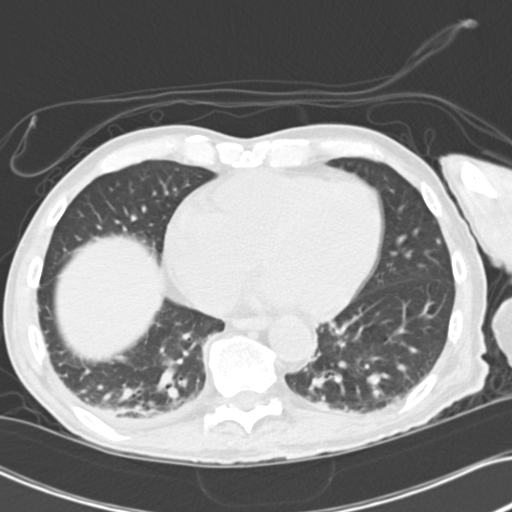
[im 159/165  lung]
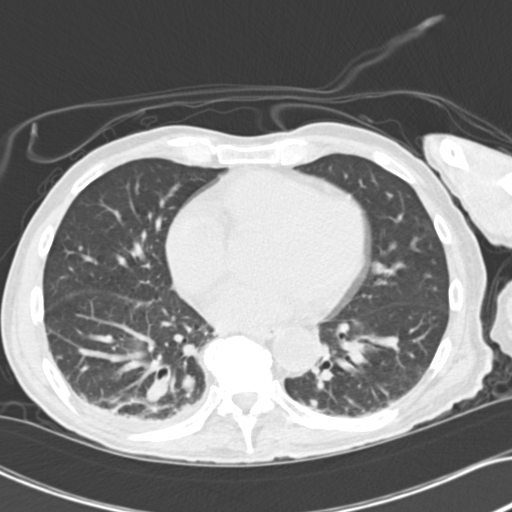

[15 of 32 positions shown; findings below may reference images not displayed]

FINDINGS: Evaluation of lung bases demonstrates hypoventilation and/or
atelectasis.

The liver, spleen and pancreas are unremarkable. Huge bilateral liver masses
are once identified. Contrast evaluation is limited though the mass on the
right appears to measure 10.77 x 6.26 cm. The mass on the left appears to be
bilobed with the largest component measuring 8.14 x 9.32 cm and the smaller
anterior component measuring 7.71 x 5.72 cm. There are findings concerning
for invasion of the left renal vein. The mass on the left appears to
possibly invade or abut the left adrenal. There is evidence of diffuse
metastatic disease within the midthoracic spine. There is no evidence of
bowel obstruction. Calcified gallstones are identified. There is no evidence
of an abdominal aortic aneurysm. Evaluation of the urinary bladder
demonstrates intermediate layering density likely representing blood
products.
IMPRESSION: 1.     Extensive bilateral renal masses consistent with neoplastic disease
with findings concerning for left renal vein invasion.
2.     Calcified gallstones are identified.
3.     There is no evidence of frank hydronephrosis. There is pelviectasis
on the left and mild hydroureter. There are findings likely representing
blood products within the urinary bladder likely secondary to hemorrhage
from the large masses particularly considering the renal vein involvement.
4.     These findings were discussed with Dr. Jeske of the Emergency
Department at the time of the initial interpretation.

## 2014-09-29 NOTE — Consult Note (Signed)
PATIENT NAME:  [name], [name] DATE OF BIRTH:  Dec 17, 1945  DATE OF CONSULTATION:  03/07/2012  REFERRING PHYSICIAN:   CONSULTING PHYSICIAN:  Cristal Deerhristopher A. Livio Ledwith, MD  INDICATION FOR CONSULTATION: Periumbilical pain.   HISTORY OF PRESENT ILLNESS: [name] is a pleasant unfortunate 69 year old male who over the last four years has been diagnosed with renal cancer which has metastasized to his bones as well as his adrenal glands. He presents with one day of periumbilical pain. He says that it began suddenly, began as feeling like a feeling of fullness yesterday and then woke up this morning hurting. He took some TUMS and it did not help and has progressively gotten worse over the last day. Says he vaguely may have had a pain like this before approximately a year ago which resolved on its own. He currently is not having much pain. He had some emesis around 8:30 after drinking contrast which was like some food he had taken containing cheese crackers, is also having normal bowel movements, passing gas, is currently getting daily chemotherapy with Afinitor. Otherwise, no fevers, chills, night sweats, shortness of breath, cough, chest pain, diarrhea or constipation, recent nausea or vomiting, dysuria, hematuria, weakness, skin lesions.   PAST MEDICAL HISTORY:  1. 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   2. Gastroesophageal reflux disease.  3. History of right renal surgery due to trauma approximately 40 years ago.  4. History of appendectomy.  5. History of back surgery.  6. History of neck surgery.  7. History of shoulder surgery.  8. History of appendectomy.   MEDICATIONS:  1. Tramadol 50 mg p.o. q.6h. p.r.n. pain.  2. Temazepam 30 mg p.o. daily.  3. Senna + 1 tablet in a.m., 2 tabs in p.m.  4. Phospha 250 neutral oral tablet 1 tab t.i.d.  5. Protonix 40 mg p.o. daily.  6. OxyContin 20 mg b.i.d.  7. Oxycodone 10 mg p.o. q.4 hours p.r.n. pain.  8. Neurontin 100 mg  p.o. t.i.d.  9. Melatonin 1 tab p.o. at bedtime.  10. Lovastatin 20 mg p.o. daily. 11. Klor-Con M20, 1 tab p.o. daily.  12. Hydrocortisone 5 mg p.o. daily.  13. Hydrocortisone 10 mg p.o. daily.  14. Fludrocortisone 0.1 mg p.o. daily.  15. Culturelle 1 tab p.o. daily.  16. Klonopin 0.5 mg p.o. b.i.d.  17. Citalopram 20 mg p.o. daily.  18. Aranesp injectable once a week.   19. Afinitor 1 tab 10 mg p.o. daily.   ALLERGIES: No known drug allergies.   FAMILY HISTORY: Does have a history of mother with lymphoma. He has family history of diabetes and hypertension.  REVIEW OF SYSTEMS: A complete 12 point review of systems is obtained. Pertinent positives and negatives as below.   PHYSICAL EXAMINATION:  VITAL SIGNS: Current temperature 87, pulse 89, blood pressure 113/69, respirations 29, 88% on room air.   GENERAL: No acute distress, alert and oriented x3. Does appear cachectic.   HEAD: Normocephalic, atraumatic.   EYES: No scleral icterus. No jaundice.   FACE: Normal external ears. Normal external nose.   CHEST: Lungs clear to auscultation, moving air well.   HEART: Regular rate and rhythm. No murmurs, rubs or gallops.   ABDOMEN: Soft, minimally tender, nondistended. No hernias.   EXTREMITIES: Moves all extremities well. Strength 5/5 all four extremities.   NEUROLOGICAL: Cranial nerves II through XII grossly intact. Neurologically sensation intact to all four extremities.   LABORATORY, DIAGNOSTIC AND RADIOLOGICAL DATA: Results are significant for a white  cell count of 4.9, hemoglobin and hematocrit 8.6/27.3, platelets 149, lactate 0.7, potassium 0.2, bicarbonate 28, lipase 27. LFTs are normal except for mildly elevated alkaline phosphatase of 176, albumin 2.4. Urinalysis is negative leukocyte esterase and nitrates. CT scan shows distended stomach with contrast actually going through entire small bowel. Does show some small bowel thickening in the left abdomen which appears to be  where there is a change in caliber from distended proximal bowel and relatively collapsed distal bowel. Contrast does go through this area, does have large areas of renal and perinephric tumor.   ASSESSMENT AND PLAN: [name] is a pleasant 69 year old male with history of metastatic renal cancer which causes him chronic bone pain and abdominal pain. He presents with epigastric pain and a CT scan which is concerning for obstruction. I believe the obstruction of note is small bowel does appear to be thickened and this may be reactive due to either infectious versus due to chemotherapy versus ischemia due to adhesions. I do not see any masses there which would indicate a malignant obstruction but I have spoken with [name] that if he did need surgical intervention we need to determine whether or not that would be the best for him with his terminal condition as this is potentially a terminal event if it is metastatic obstruction. He is soft and not throwing up right now therefore does not necessarily need an NG tube at this time but would recommend one if he continues to throw up. Will continue to follow with him.   ____________________________ Si Raider. Faye Strohman, MD cal:cms D: 03/07/2012 00:26:41 ET T: 03/07/2012 09:36:58 ET JOB#: [id number]  cc: Cristal Deer A. Talaysha Freeberg, MD, <Dictator>  Jarvis Newcomer MD ELECTRONICALLY SIGNED 03/07/2012 18:54

## 2014-09-29 NOTE — H&P (Signed)
PATIENT NAME:  [name], [name] MR#:  [id number] DATE OF BIRTH:  May 03, 1946  DATE OF ADMISSION:  03/06/2012  PRIMARY CARE PHYSICIAN: Dr. Wallene Huh ([facility])  ONCOLOGIST: Johney Maine, MD  CHIEF COMPLAINT: Abdominal pain x2 days.   HISTORY OF PRESENT ILLNESS: 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 The patient is receiving palliative treatment and recently received a blood transfusion for his anemia. However, over the last two days, he has escalating abdominal pain located in the central abdominal area. The pain is described as dull pain at rest, however, the pain becomes sharp upon movement. The severity of pain is 7 to 10 on a scale of 10. The pain is now down to 3 after receiving pain medication. Along with that, he has nausea and vomiting. He has no hematemesis, no coffee-ground emesis, and no melena or hematochezia. His last bowel movement was a few hours ago and was of normal color. The patient was brought to the hospital for evaluation. He had a CAT scan of the abdomen which showed evidence of his metastatic disease. In addition to that, there is a raised issue of partial small bowel obstruction and recommended surgical consultation. There is also evidence of cholelithiasis and postoperative changes.  REVIEW OF SYSTEMS: CONSTITUTIONAL: Denies any fever. No chills. He has mild fatigue. EYES: No blurring of vision. No double vision. ENT: No hearing impairment. No sore throat. No dysphagia. CARDIOVASCULAR: No chest pain. No shortness of breath. No syncope. RESPIRATORY: No cough. No sputum production. No hemoptysis. No chest pain. GASTROINTESTINAL: Reports abdominal pain, nausea and vomiting. No diarrhea. No melena or hematochezia. GENITOURINARY: No dysuria. No frequency of urination. MUSCULOSKELETAL: No joint pain or swelling. No muscular pain or swelling. INTEGUMENTARY: No skin rash. No  ulcers. No swelling. NEUROLOGY: No focal weakness. No seizure activity. No headache. PSYCHIATRY: No anxiety. No depression. He has anxiety by history only. ENDOCRINE: No polyuria or polydipsia. No heat or cold intolerance.      PAST MEDICAL HISTORY:  1. 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  2. Gastroesophageal reflux disease. 3. Hypercholesterolemia. 4. Metastatic disease to the bone also to the adrenal gland.  5. Peripheral neuropathy.  6. Insomnia.  7. Chronic pain.  8. Tobacco abuse.   PAST SURGICAL HISTORY:  1. Removal of one-third of the left kidney for his cancer.  2. History of left shoulder surgery. 3. Back surgery.  4. Neck surgery.  5. Appendectomy.  6. Vasectomy.  7. Last colonoscopy was in 2006.   FAMILY HISTORY: His mother died from lymphoma. His father died when he was young from a car accident.   SOCIAL HABITS: Chronic smoker of one pack per day since age of 56 and he continues to smoke. No history of alcohol or drug abuse.   SOCIAL HISTORY: The patient is a retired Data processing manager. He is married and living with his wife.   ADMISSION MEDICATIONS:  1. Lovastatin 20 mg a day. 2. Temazepam 30 mg at night. 3. Melatonin one tablet at night. 4. Citalopram 20 mg a day. 5. Hydrocortisone 10 mg in the morning and 5 mg in the evening.  6. Fludrocortisone 0.1 mg once a day in the morning. 7. Neurontin 100 mg three times daily. 8. Afinitor 10 mg a day. 9. Pantoprazole (Protonix) 40 mg once a day. 10. OxyContin 20 mg twice a day. 11.  Oxycodone 10 mg every four hours p.r.n. for pain.  12. Tramadol 50 mg every 6 hours p.r.n. 13. Klor-Con (potassium) 20 mEq once a day.  14. Aranesp given once a week, injectable.  15. Senna Plus one in the morning and two in the evening.  16. Neutral Phos one tablet three times daily. 17. Clonazepam 0.5 mg twice a day.   ALLERGIES: No known drug allergies.    PHYSICAL EXAMINATION:   VITAL SIGNS: Blood pressure 113/69, respiratory rate 20, pulse 89, temperature 97, and oxygen saturation 88%.   GENERAL APPEARANCE: Elderly male lying in bed in no acute distress. He looks in mild to moderate pain.   HEAD/NECK: He has mild pallor. No icterus. No cyanosis.   EARS, NOSE, AND THROAT: Hearing was normal. Nasal mucosa, lips, and tongue were normal.   EYES: Normal iris and conjunctivae. Pupils are about 6 mm, equal and reactive to light.   NECK: Supple. Trachea at midline. No thyromegaly. No masses.   CARDIOVASCULAR: Normal S1 and S2. No S3 or S4. No murmur. No gallop. No carotid bruits.   RESPIRATORY: Normal breathing pattern without use of accessory muscles. No rales. No wheezing.   ABDOMEN: Soft. There is mild to moderate tenderness in the central abdominal area and to the side. No rebound. No rigidity. No hepatosplenomegaly. No hernias.   SKIN: No ulcers. No subcutaneous nodules.   MUSCULOSKELETAL: No joint swelling. No clubbing.   NEUROLOGIC: Cranial nerves II through XII are intact. No focal motor deficit.   PSYCHIATRY: The patient is alert and oriented x3. Mood and affect were normal.  LABORATORY, DIAGNOSTIC AND RADIOLOGIC DATA: CAT scan of the abdomen showed multiple masses consistent with bilateral renal cell carcinoma with bony and pulmonary metastatic disease. Partial small bowel obstruction. Small bilateral pleural effusions. Evidence of cholelithiasis. Postoperative changes in the spine.   EKG showed normal sinus rhythm at rate of 74 per minute.   Serum glucose 73, BUN 13, creatinine 0.93, sodium 143, potassium 2.9, and calcium 9.1. Liver function tests were normal except for elevated alkaline phosphatase of 176. Serum albumin is low at 2.4. Bilirubin was normal at 0.6. CBC showed white count of 4000 and hemoglobin 8.6. His hemoglobin on 03/05/2012 was 7.5. He has received only one unit of blood transfusion. Hematocrit 27 and  platelet count 149.   Urinalysis was unremarkable.   ASSESSMENT:  1. Abdominal pain with findings appearing to be partial small bowel obstruction.  2. Metastatic stage IV renal cell carcinoma. 3. 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  4. Metastasis to the bones. 5. Metastasis to the adrenal gland.  6. Hypokalemia.  7. Anemia, appears to be component of anemia of chronic disease and iron deficiency anemia. 8. Hypercholesterolemia.  9. Chronic pain.  10. Tobacco abuse.  11. Gastroesophageal reflux disease.   PLAN: We will admit the patient to the medical floor and keep the patient n.p.o. I will resume only the essential medications that he will be able to take. I will hold the Ultram and lovastatin. Afinitor is not on the formulary. I will hold it unless it is necessary. I will consult Dr. Doylene Canninghoksi and decide if Afinitor is essential during the admission or not. Also we will address the anemia. His hemoglobin now is above 8 after one blood transfusion. He has one more unit; it is on hold. I will let them decide whether he wants to transfuse the other unit or to hold for the time being. Correct the hypokalemia. Pain control with intravenous Dilaudid, in  addition to the oral OxyContin. Symptomatic treatment of the nausea and vomiting. Follow-up on the electrolyte abnormality. Surgical consultation was obtained and the patient was seen by general surgery. They felt this is not a surgical case. There was discussion about the strange appearance of edematous bowel, however, at the time being, given his underlying metastatic disease, we will go conservative. The patient will be given time to think about his underlying problem and the magnitude of intervention regarding aggressive versus palliative care. He was also not certain about his code status whether FULL CODE or not. At the time being, he will be FULL CODE until he has a clear decision. The patient indicates that he has a Living Will. The patient will need to quit  smoking. However, at the time being, we will offer a nicotine patch while he is here in the hospital.   TIME SPENT: Time spent evaluating this patient took more than 55 minutes including reviewing the medical record, discussion with the wife and with the patient, and also with the surgical consult.  ____________________________ Carney Corners. Rudene Re, MD amd:slb D: 03/06/2012 23:37:47 ET T: 03/07/2012 07:47:35 ET JOB#: [id number]  cc: Carney Corners. Rudene Re, MD, <Dictator> Dr. Wallene Huh ([facility]) Karolee Ohs Dala Dock MD ELECTRONICALLY SIGNED 03/07/2012 22:32

## 2014-09-29 NOTE — Consult Note (Signed)
    Comments   Spoke at length with pt's wife and son about re-intubation. After much discussion, wife has decided that pt should be a DNI. Order entered. The only resuscitative measures she wants for pt are anti-arrhythmics/pressors and defibrillation. Will start olanzapine ODT for delerium and continue prn haldol. Hydromorphone and oxycodone d/c'd.   Electronic Signatures: Jihan Rudy, Harriett SineNancy (MD)  (Signed 02-Oct-13 09:07)  Authored: Palliative Care   Last Updated: 02-Oct-13 09:07 by Thora Scherman, Harriett SineNancy (MD)

## 2014-09-29 NOTE — Consult Note (Signed)
Note Type Consult   HPI: Referred by PCP, Dr. Meredith Staggers.   This 69 year old Male patient presents to the clinic for follow up  for fatigue, anemia.  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  HPI:   recently yesterday getting blood transfusion started having abdominal pain was transferred emergency room patient is feeling extremely weak tired had a high fever , hypotension,  hypoxia.  Colonoscopy evaluate had a CT scan done which revealed multiple o masses kidney mass.is somewhat confused lethargic family with patient.   Review of Systems:   General: fever  chills  weakness   Performance Status (ECOG): 3   HEENT: no complaints   Lungs: cough  SOB   Cardiac: Tachycardia   GI: nausea/vomiting   GU: no complaints   Musculoskeletal: no complaints   Extremities: No edema   Neuro: Somewhat confused and tired the   Psych: anxiety  depression   Pain ?: Yes   Pain- Qualitative: Moderate   Pain- Plan: Narcotic analgesic ordered   Pain Effectiveness: Pain relief not obtained   Review of Systems: All other systems were reviewed and found to be negative   Review of Systems:   declining performance status  Allergies:  No Known Allergies:   Significant History/PMH:   Bone Met:    GERD - Esophageal Reflux:    Cancer, Renal:    left shoulder:    back surgery:    1/3 of left kidney removed:    Neck Surgery:    vasectomy:    Appendectomy:   PFSH:  Additional Past Medical and Surgical History: Past  medical history: Hyperlipidemia    Past surgical History: T10-L3 posterior fusion, L1 posterior transpedicular corpectomy, placement of expandable cage at L1 at [facility], Vasectomy    Social History: Positive tobacco, one pack per day x40+ years, denies alcohol.  Married with 2 children.    Family history: Mother with CLL, father died young from an [place].   Home Medications: Medication Instructions Last Modified Date/Time  lovastatin 20 mg oral tablet 1 tab(s) orally once a day (7 am) 26-Sep-13 00:45  temazepam 30 mg oral capsule 1 cap(s) orally once a day (11 pm) 26-Sep-13 00:45  citalopram 20 mg oral tablet 1 tab(s) orally once a day (7 am) 26-Sep-13 00:45  hydrocortisone 10 mg oral tablet 1 tab(s) orally once a day (7 am) 26-Sep-13 00:45  hydrocortisone 5 mg oral tablet 1 tab(s) orally once a day (3 pm) 26-Sep-13 00:45  fludrocortisone 0.1 mg oral tablet 1 tab(s) orally once a day (7 am) 26-Sep-13 00:45  Neurontin 100 mg oral capsule 1 cap(s) orally 3 times a day (7 am, 3 pm, 11 pm)  26-Sep-13 00:45  Afinitor 10 mg oral tablet 1 tab(s) orally once a day (7 am) 26-Sep-13 00:45  pantoprazole 40 mg oral delayed release tablet 1 tab(s) orally once a day (7 am) 26-Sep-13 00:45  OxyContin 20 mg oral tablet, extended release 1 tab(s) orally 2 times a day (7 am, 7 pm) 26-Sep-13 00:45  oxycodone 10 mg oral tablet 1 tab(s) orally every 4  hours, As Needed- for Pain  26-Sep-13 00:45  tramadol 50 mg oral tablet 1 tab(s) orally every 6 hours, As Needed- for Pain  26-Sep-13 00:45  Melatonin 1 tab(s) orally once a day (11 pm) 26-Sep-13 00:45  Klor-Con M20 oral tablet, extended release 1 tab(s) orally once a day (7 am) 26-Sep-13 00:45  Aranesp milliliter(s) injectable once a week, As Needed for cancer treatment 26-Sep-13 00:45  Senna Plus 50 mg-8.6 mg oral tablet 1 tab(s) orally once a day at 7 am and 2 tablets once a day at 11 pm 26-Sep-13 00:45  Phospha 250 Neutral oral tablet 1 tab(s) orally 3 times a day  (7am, 3pm, 11pm) 26-Sep-13 00:45  clonazepam 0.5 mg oral tablet 1 tab(s) orally 2 times a day (7am, 7pm) 26-Sep-13 00:45  Culturelle Digestive Health oral capsule 1 cap(s) orally once a day (7 am) 26-Sep-13 00:45   Vital Signs:   :: Wt(KG): 57.7 Temp: 98 Pulse: 89 RR: 18  BP: 112/66   Physical Exam:   General: Patient was in mild distress because of pain.   Mental Status: Patient is somewhat lethargic.   Head, Ears, Nose,Throat: normal, no lesions or deformities   Neck, Thyroid: no thyroid tenderness, enlargement or nodule.  neck supple without massess or tenderness. no adenopathy.  no carotid bruit.   Respiratory: no rales, rhonchi, retractions or wheezing   Cardiovascular: Tachycardia   Gastrointestinal: Abdomen was slightly tender.  Bowel sounds are diminished.  Pain pain was generalized tenderness was generalized   Musculoskeletal: No deformity..   No evidence of fracture..   No joint swelling..   Lower extremity no edema   Skin: no rashes, ulcers, or lesions   No ecchymosis.  No petechial hemorrhage   Neurological: normal motor exam, gait, 5 x 5, strength, grip, pressure, flexor, extensor   Lymphatics: no cervical, axillary, or inguinal lymphadenopathy   Laboratory Results: Lab:  26-Sep-13 13:40    pH (ABG) 7.42   PCO2 39   PO2  58   FiO2 40   Base Excess 0.8   HCO3 25.3   O2 Saturation 90.3   Patient Temp (ABG) 37.0 (Result(s) reported on 07 Mar 2012 at 01:50PM.)   O2 Device CANNULA   Specimen Type (ABG) ARTERIAL    15:35    pH (ABG)  7.45   PCO2  33   PO2  67   FiO2 40   Base Excess -0.4   HCO3 22.9   O2 Saturation 93.9   Specimen Site (ABG) RT RADIAL   Patient Temp (ABG) 37.0   Vt 500   PEEP 5.0   Mechanical Rate 18 (Result(s) reported on 07 Mar 2012 at 03:38PM.)  Routine Chem:  26-Sep-13 04:52    Glucose, Serum  64   BUN 10   Creatinine (comp) 0.88   Sodium, Serum 145   Potassium, Serum 3.6   Chloride, Serum 106   CO2, Serum 29   Calcium (Total),  Serum 8.8   Anion Gap 10   Osmolality (calc) 286   eGFR (African American) >60   eGFR (Non-African American) >60 (eGFR values <29m/min/1.73 m2 may be an indication of chronic kidney disease (CKD). Calculated eGFR is useful in patients with stable renal function. The eGFR calculation will not be reliable in acutely ill patients when serum creatinine is changing rapidly. It is not useful in  patients on dialysis. The eGFR calculation may not be applicable to patients at the low and high extremes of body sizes, pregnant women, and vegetarians.)  Routine UA:  25-Sep-13 18:13    Color (UA) Yellow   Clarity (UA) Clear   Glucose (UA) Negative   Bilirubin (UA) Negative   Ketones (UA) Trace   Specific Gravity (UA) 1.016   Blood (UA) Negative   pH (UA) 7.0   Protein (UA) 30 mg/dL   Nitrite (UA) Negative   Leukocyte Esterase (UA) Negative (Result(s) reported on 06 Mar 2012 at 06:36PM.)   RBC (UA) 5 /HPF   WBC (UA) 1 /HPF   Bacteria (UA) TRACE   Epithelial Cells (UA) NONE SEEN   Mucous (UA) PRESENT (Result(s) reported on 06 Mar 2012 at 06:36PM.)   Lab Results Review:   Lab Results   CBC  :  05-Mar-2012 08:32:  Last Resulted Date/Time.5.5 Hgb: 7.5(L) Hct: 24.4(L) Plat: 161 MCV: 81 ANC: 3.2 .  :  27-Oct-2011 16:47:  Last Resulted Date/Time.140 K+: 3.6 CO2: 28 Chl: 104 BUN: 14 Cre: 0.74 Glu:97 Calcium: 9.2 . Metabolic Panel  :  [id number] 17:08:  Last Resulted Date/Time.143 K+: 2.9(L) CO2: 28 Chl: 105 BUN: 13 Cre: 0.93 Glu:73 Calcium: 9.1 Alk Phos: 176(H) ALT: 16 AST: 22 T.Prot: 7.2 Alb: 2.4(L) eGFR-AA: >60 eGFR: >60 .  :  21-Sep-2011 09:36:  Last Resulted Date/Time.2.1 .   Medical Imaging Results:    Review Medical Imaging   Abdomen and Pelvis With Contrast 06-Mar-2012 21:31:00: IMPRESSION:   1. Multiple masses consistent with bilateral renal cell carcinoma with   bony and pulmonary metastatic disease. Partial small bowel obstruction.   Surgical consultation is recommended.  2.  Cholelithiasis.  3. Postoperative changes in the spine.    Dictation Site: 6          Verified By: Sundra Aland, M.D., MD Portable Single View 07-Mar-2012 11:42:00: IMPRESSION:   1. The lungs are hypoinflated and bibasilar areas of atelectasis or     pneumonia are present.  2. Lucency under the left hemidiaphragm could reflect extraluminal gas.   An abdominal series or abdominal CT scan is recommended.     Dictation Site: 2          Verified By: DAVID A. Martinique, M.D., MD Portable Single View 07-Mar-2012 14:50:00: IMPRESSION:  There is no evidence of post procedure complication   following placement of a left internal jugular venous catheter. The   endotracheal tube tip position at 2 cm above the carina merits withdrawal   by approximately 2 cm to avoid accidental mainstem bronchus intubation.     Dictation Site: 2          Verified By: DAVID A. 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  Patient is getting most of his care  in the cancer center of [place] in [place].and desires to go back for surgery in the ankle that has been done on 8 October.  Patient's overall prognosis is poor Family this is most aggressive treatment Overall I feel that patient may not benefit from aggressive chemotherapy or radiation therapy at present time.  Continue supportive therapy.  Palliative care consult has been appreciated.  Discussed situation with  Dr. Vallery Ridge, family, and hospitalist.   Electronic Signatures: Jobe Gibbon (MD)  (Signed 26-Sep-13 16:55)  Authored: Note Type, History of Present Illness, CC/HPI, Review of Systems, ALLERGIES, PAST MEDICAL HISTORY, Patient Family Social History, HOME MEDICATIONS, Vital  Signs, Physical Exam, Lab Results Review, Rad Results Review, Assessment and  Plan   Last Updated: 26-Sep-13 16:55 by Jobe Gibbon (MD)

## 2014-09-29 NOTE — Discharge Summary (Signed)
PATIENT NAME:  [name], [name] DATE OF BIRTH:  03-02-1946  DATE OF ADMISSION:  03/06/2012 DATE OF DISCHARGE:  03/18/2012  ADDENDUM: This is an addendum to the Discharge Summary done by Dr. Dr. Jacques NavyAhmadzia on October 4th.  This Discharge Summary covers the patient's hospital course from October 5th to October 7th.   DISCHARGE DIAGNOSES: 1. Metastatic renal cell cancer.  2. Acute respiratory failure. 3. Septic shock.  4. Partial small bowel obstruction.  5. Hypokalemia.  6. Anemia.  7. Agitation.  8. Hallucinations. 9. Adrenal insufficiency.  10. Chronic pain syndrome.   DISPOSITION: The patient is being discharged [facility].   DISCHARGE MEDICATIONS:  1. KCl 20 mEq daily.  2. Colace 50/8.6, 1 tablet b.i.d.  3. Hydrocortisone cream, apply to affected area b.i.d.  4. Risperdal 0.5 mg every 6 hours Patel.r.n. agitation.  5. Risperdal 0.5 mg daily.  6. Fentanyl patch 100 mcg every 72 hours.  7. Oxycodone 50 mg every 4 hours Patel.r.n.  8. Tylenol 325 mg every 4 hours Patel.r.n.  9. Citalopram 20 mg daily.  10. Hydrocortisone 10 mg in the morning and 5 mg at 3:00 Patel.m.  11. Florinef 0.5 mg daily.  12. Neurontin 100 mg t.i.d.  13. Klonopin 0.5 mg b.i.d.  CODE STATUS: DO NOT RESUSCITATE.   HOSPITAL COURSE: The patient is a 69 year old male with history of metastatic renal cell cancer who was initially admitted to the Intensive Care Unit for acute respiratory failure. He was successfully extubated. He completed a course of antibiotics. He was weaned off the vasopressors. His partial small bowel obstruction had resolved and he was tolerating oral diet. Given his underlying malignancy, he was very, very debilitated. Initially the patient's wife wanted him to go to rehab, but after discussion with her husband and other family members, realizing the patient's poor prognosis and debilitated condition, they decided for Hospice facility placement. The patient was discharged to Hospice  facility in a stable condition.   TIME SPENT: 45 minutes.  ____________________________ Darrick MeigsSangeeta Kal Chait, MD sp:cbb D: 03/19/2012 14:58:50 ET T: 03/20/2012 09:44:09 ET JOB#: [id number]  cc: Darrick MeigsSangeeta Kendria Halberg, MD, <Dictator> [facility], Dr. Billy Coastarew Rosie Golson MD ELECTRONICALLY SIGNED 03/20/2012 14:18
